# Patient Record
Sex: Female | Born: 1990 | Race: White | Hispanic: No | Marital: Single
Health system: Southern US, Community
[De-identification: ages and names within clinical notes are randomized; demographics above are authoritative.]

---
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
## 2008-04-01 ENCOUNTER — Ambulatory Visit (HOSPITAL_COMMUNITY): Admission: RE | Admit: 2008-04-01 | Discharge: 2008-04-01 | Payer: Self-pay | Admitting: Family Medicine

## 2008-04-01 ENCOUNTER — Emergency Department (HOSPITAL_COMMUNITY): Admission: EM | Admit: 2008-04-01 | Discharge: 2008-04-01 | Payer: Self-pay | Admitting: Emergency Medicine

## 2008-04-25 ENCOUNTER — Encounter: Admission: RE | Admit: 2008-04-25 | Discharge: 2008-04-25 | Payer: Self-pay | Admitting: Family Medicine

## 2008-11-08 ENCOUNTER — Other Ambulatory Visit: Admission: RE | Admit: 2008-11-08 | Discharge: 2008-11-08 | Payer: Self-pay | Admitting: Obstetrics and Gynecology

## 2008-12-12 ENCOUNTER — Emergency Department (HOSPITAL_COMMUNITY): Admission: EM | Admit: 2008-12-12 | Discharge: 2008-12-12 | Payer: Self-pay | Admitting: Family Medicine

## 2009-01-31 IMAGING — CR DG CHEST 2V
2 series · 2 of 2 positions shown · non-contrast
Comparison: Chest x-ray of 04/01/2008

CLINICAL DATA: Follow up of left lower lobe pneumonia

CHEST - 2 VIEW

[w chest pa]
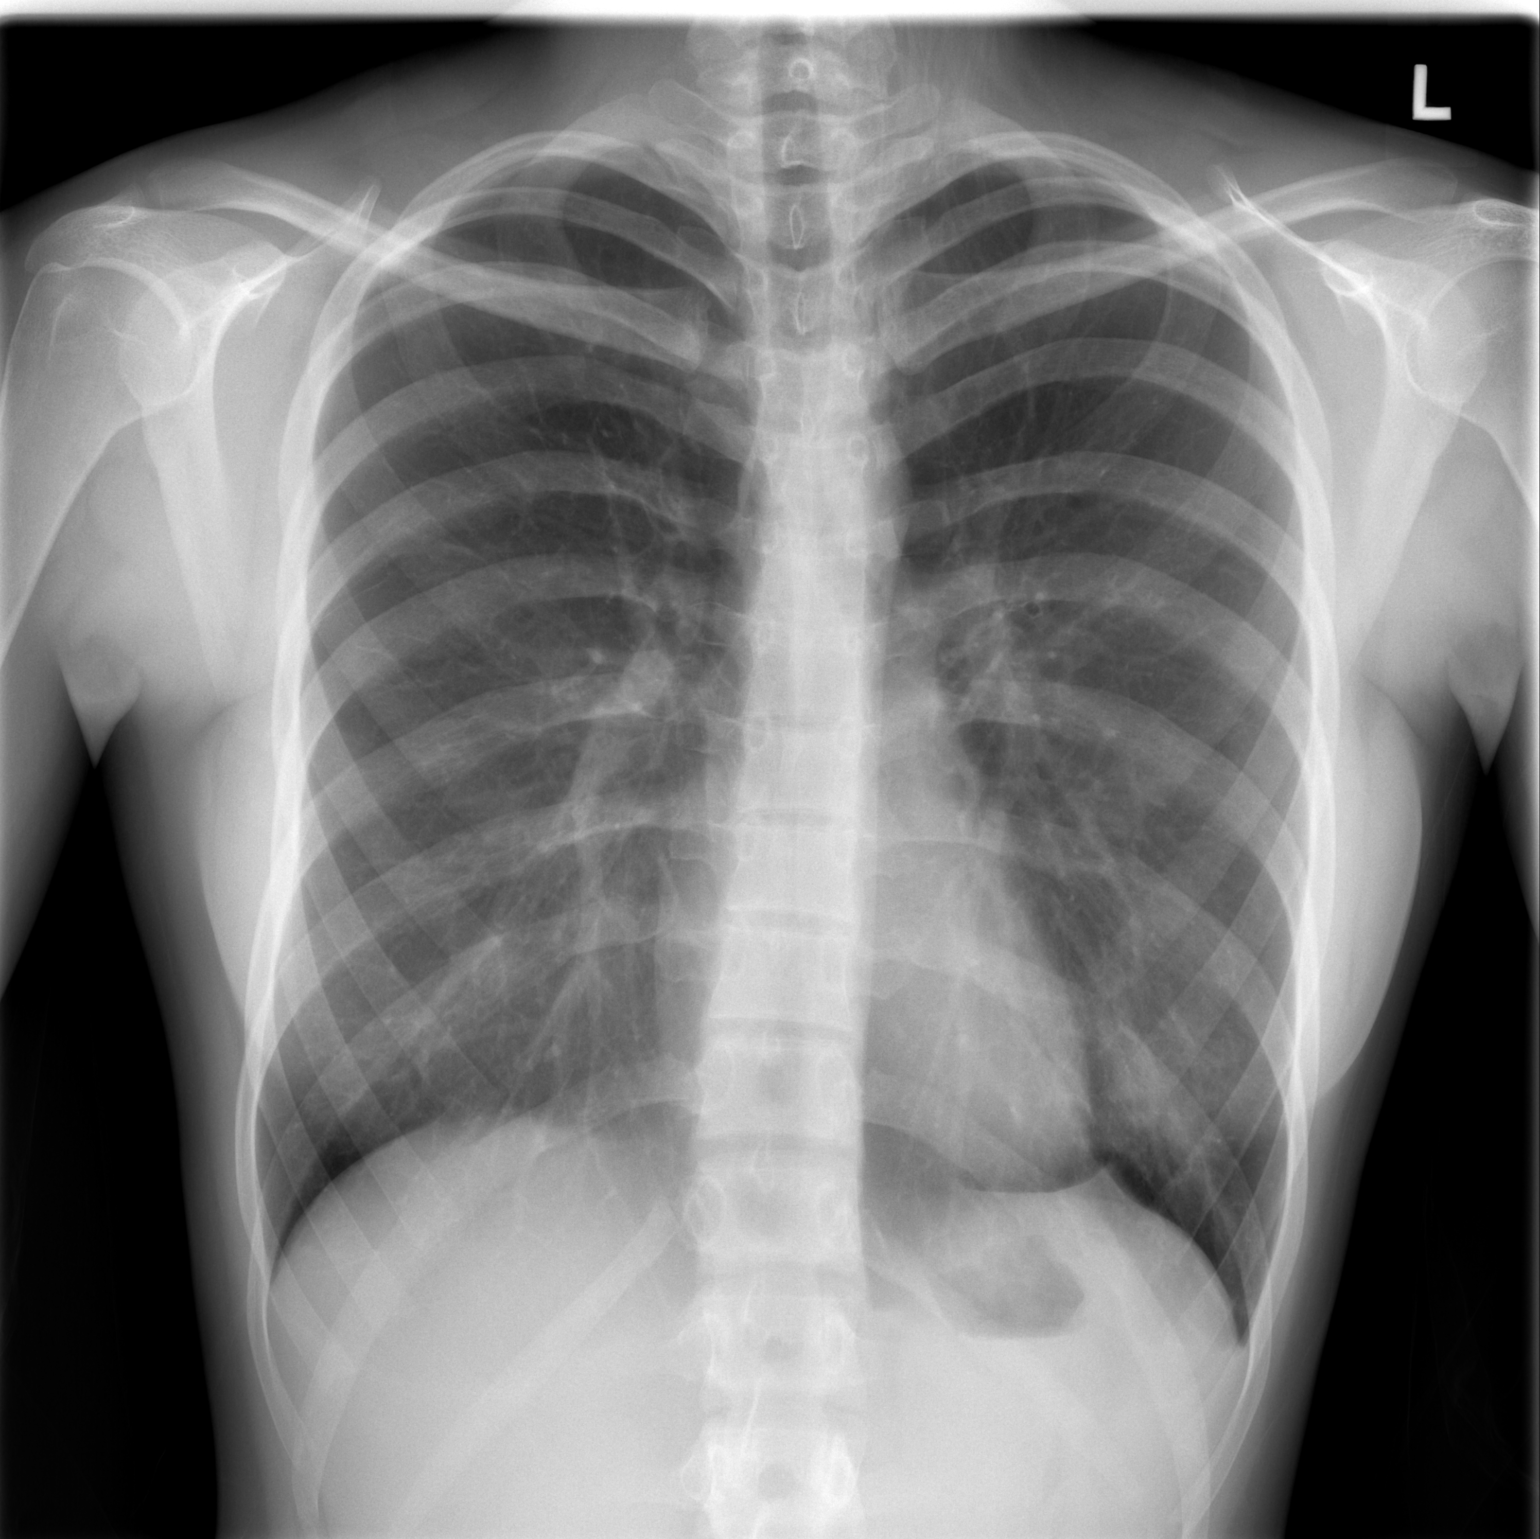

[w chest lat]
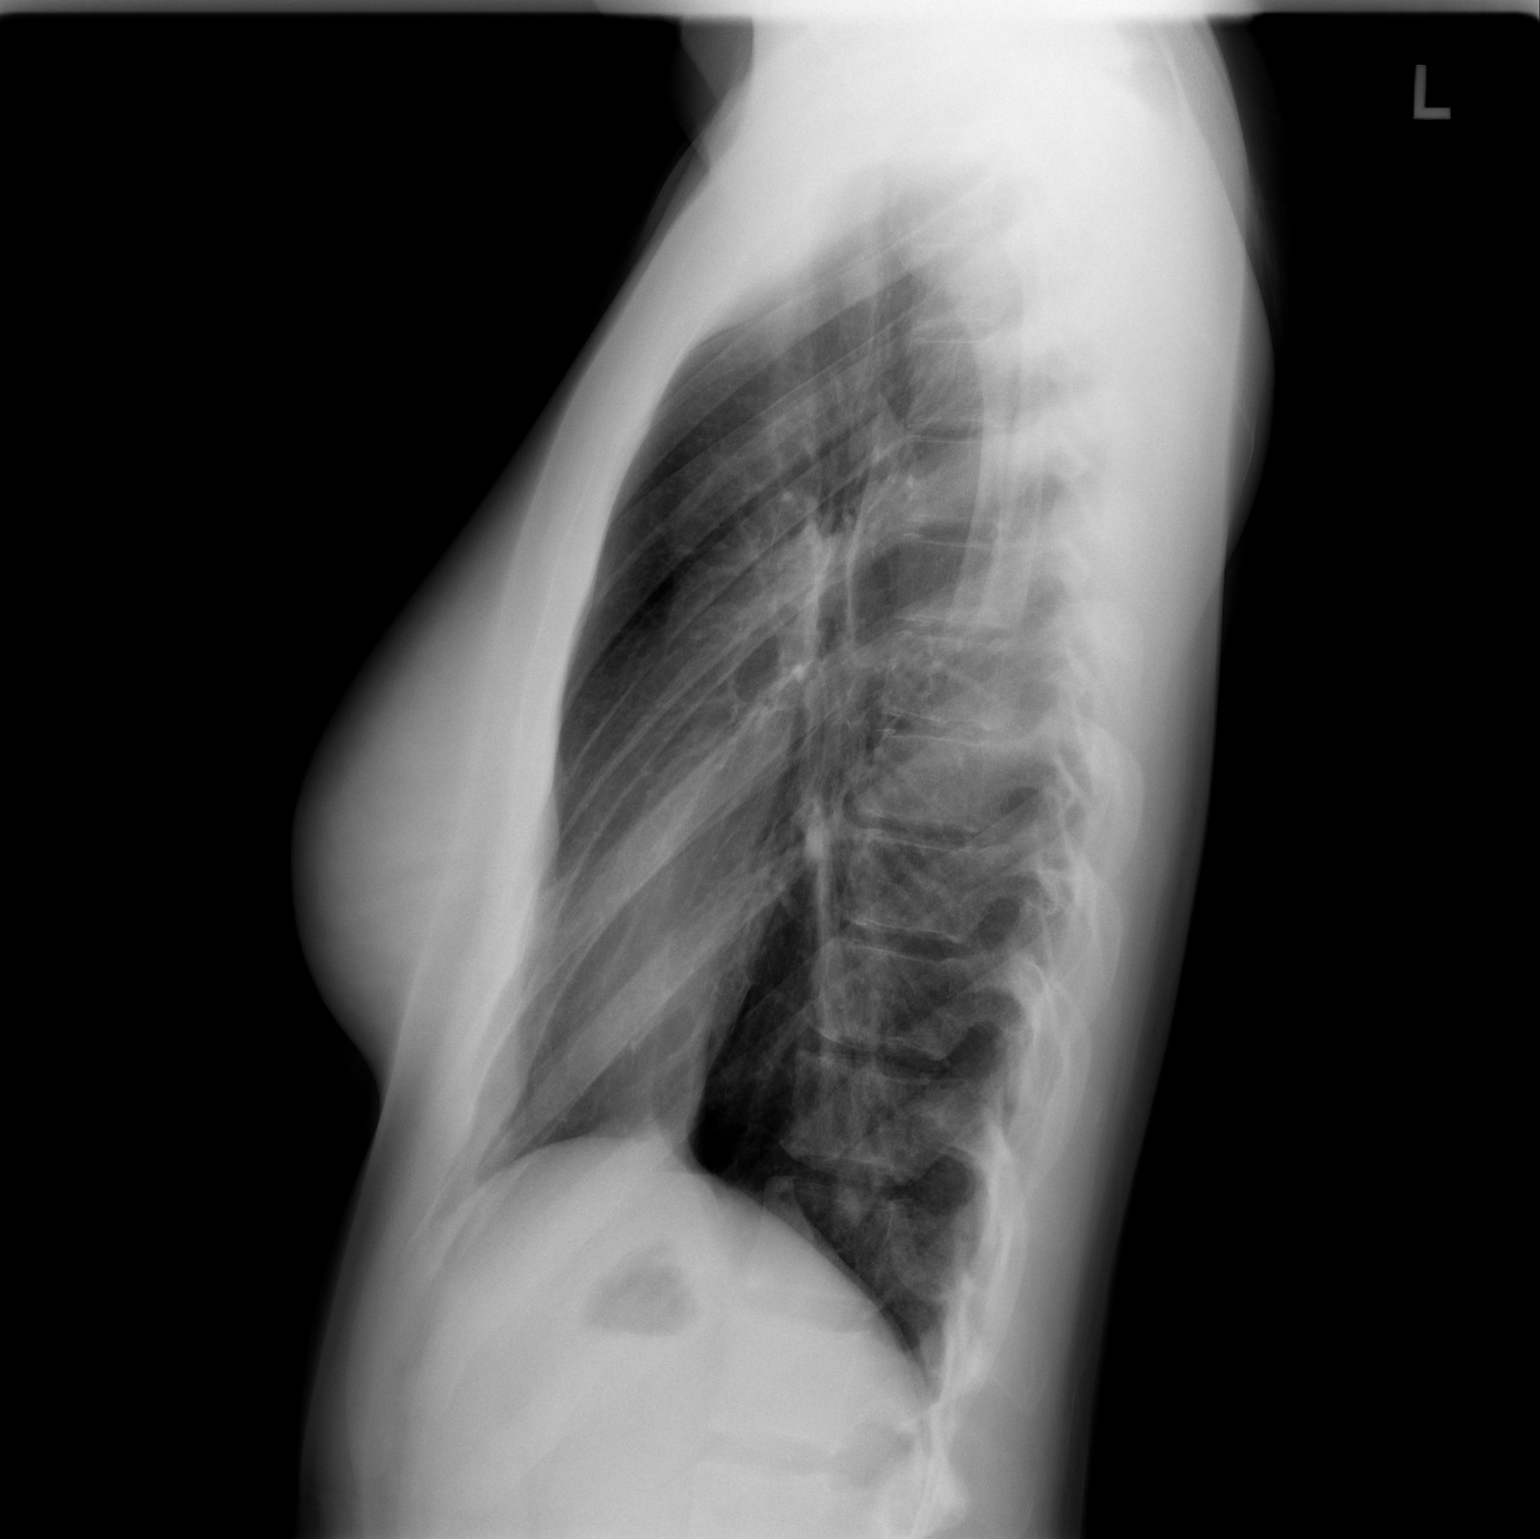

[2 of 2 positions shown; findings below may reference images not displayed]

FINDINGS: The previously noted left lower lobe pneumonia has
cleared.  No infiltrate or effusion is seen.  Heart is within
normal limits in size.
IMPRESSION: No active lung disease.  The left lower lobe pneumonia has cleared.

## 2009-11-25 ENCOUNTER — Emergency Department (HOSPITAL_COMMUNITY): Admission: EM | Admit: 2009-11-25 | Discharge: 2009-11-25 | Payer: Self-pay | Admitting: Emergency Medicine

## 2011-01-26 LAB — POCT RAPID STREP A (OFFICE): Streptococcus, Group A Screen (Direct): NEGATIVE

## 2011-02-25 LAB — POCT INFECTIOUS MONO SCREEN: Mono Screen: NEGATIVE

## 2011-02-25 LAB — POCT RAPID STREP A (OFFICE): Streptococcus, Group A Screen (Direct): NEGATIVE

## 2011-08-06 LAB — CBC
HCT: 36
MCV: 84.1
Platelets: 407 — ABNORMAL HIGH
RDW: 13.6

## 2011-08-06 LAB — DIFFERENTIAL
Basophils Absolute: 0.1
Basophils Relative: 0
Eosinophils Absolute: 0.1
Eosinophils Relative: 1

## 2011-08-06 LAB — POCT INFECTIOUS MONO SCREEN: Mono Screen: NEGATIVE

## 2013-12-05 ENCOUNTER — Other Ambulatory Visit: Payer: Self-pay | Admitting: Certified Nurse Midwife

## 2013-12-05 NOTE — Telephone Encounter (Signed)
S/w [place] in [place] is going to see a GYN in [place] sometime in February. Patient says [facility] does have enough to last her until appointment in February.  Denied RX
